# Patient Record
Sex: Female | Born: 1960 | Race: White | Hispanic: No | Marital: Married | Smoking: Never smoker
Health system: Southern US, Community
[De-identification: ages and names within clinical notes are randomized; demographics above are authoritative.]

---

## 2000-04-20 ENCOUNTER — Other Ambulatory Visit: Admission: RE | Admit: 2000-04-20 | Discharge: 2000-04-20 | Payer: Self-pay | Admitting: Obstetrics and Gynecology

## 2000-07-29 ENCOUNTER — Ambulatory Visit (HOSPITAL_COMMUNITY): Admission: RE | Admit: 2000-07-29 | Discharge: 2000-07-29 | Payer: Self-pay | Admitting: Obstetrics and Gynecology

## 2000-07-29 ENCOUNTER — Encounter: Payer: Self-pay | Admitting: Obstetrics and Gynecology

## 2000-08-24 ENCOUNTER — Encounter: Payer: Self-pay | Admitting: Neurology

## 2000-08-24 ENCOUNTER — Ambulatory Visit (HOSPITAL_COMMUNITY): Admission: RE | Admit: 2000-08-24 | Discharge: 2000-08-24 | Payer: Self-pay | Admitting: Neurology

## 2000-09-14 ENCOUNTER — Inpatient Hospital Stay (HOSPITAL_COMMUNITY): Admission: RE | Admit: 2000-09-14 | Discharge: 2000-09-16 | Payer: Self-pay | Admitting: Obstetrics and Gynecology

## 2000-09-14 ENCOUNTER — Encounter (INDEPENDENT_AMBULATORY_CARE_PROVIDER_SITE_OTHER): Payer: Self-pay | Admitting: Specialist

## 2000-11-04 ENCOUNTER — Encounter
Admission: RE | Admit: 2000-11-04 | Discharge: 2000-11-04 | Payer: Self-pay | Admitting: Physical Medicine & Rehabilitation

## 2000-11-04 ENCOUNTER — Encounter: Payer: Self-pay | Admitting: Obstetrics and Gynecology

## 2000-11-15 ENCOUNTER — Other Ambulatory Visit: Admission: RE | Admit: 2000-11-15 | Discharge: 2000-11-15 | Payer: Self-pay | Admitting: General Surgery

## 2001-02-06 ENCOUNTER — Encounter: Payer: Self-pay | Admitting: General Surgery

## 2001-02-06 ENCOUNTER — Encounter: Admission: RE | Admit: 2001-02-06 | Discharge: 2001-02-06 | Payer: Self-pay | Admitting: General Surgery

## 2002-02-07 ENCOUNTER — Ambulatory Visit (HOSPITAL_COMMUNITY): Admission: RE | Admit: 2002-02-07 | Discharge: 2002-02-07 | Payer: Self-pay | Admitting: Obstetrics and Gynecology

## 2002-02-07 ENCOUNTER — Encounter: Payer: Self-pay | Admitting: Obstetrics and Gynecology

## 2002-11-12 ENCOUNTER — Encounter: Payer: Self-pay | Admitting: General Surgery

## 2002-11-12 ENCOUNTER — Encounter: Admission: RE | Admit: 2002-11-12 | Discharge: 2002-11-12 | Payer: Self-pay | Admitting: General Surgery

## 2003-03-11 ENCOUNTER — Encounter: Payer: Self-pay | Admitting: General Surgery

## 2003-03-11 ENCOUNTER — Encounter: Admission: RE | Admit: 2003-03-11 | Discharge: 2003-03-11 | Payer: Self-pay | Admitting: General Surgery

## 2003-09-17 ENCOUNTER — Other Ambulatory Visit: Admission: RE | Admit: 2003-09-17 | Discharge: 2003-09-17 | Payer: Self-pay | Admitting: Obstetrics and Gynecology

## 2003-09-23 ENCOUNTER — Encounter: Admission: RE | Admit: 2003-09-23 | Discharge: 2003-09-23 | Payer: Self-pay | Admitting: Obstetrics and Gynecology

## 2003-12-20 ENCOUNTER — Encounter: Admission: RE | Admit: 2003-12-20 | Discharge: 2003-12-20 | Payer: Self-pay | Admitting: Obstetrics and Gynecology

## 2004-09-22 ENCOUNTER — Encounter: Admission: RE | Admit: 2004-09-22 | Discharge: 2004-09-22 | Payer: Self-pay | Admitting: Obstetrics and Gynecology

## 2004-09-24 ENCOUNTER — Other Ambulatory Visit: Admission: RE | Admit: 2004-09-24 | Discharge: 2004-09-24 | Payer: Self-pay | Admitting: Obstetrics and Gynecology

## 2005-01-27 ENCOUNTER — Encounter: Admission: RE | Admit: 2005-01-27 | Discharge: 2005-01-27 | Payer: Self-pay | Admitting: Obstetrics and Gynecology

## 2005-07-21 ENCOUNTER — Encounter: Admission: RE | Admit: 2005-07-21 | Discharge: 2005-07-21 | Payer: Self-pay | Admitting: General Surgery

## 2005-09-27 ENCOUNTER — Other Ambulatory Visit: Admission: RE | Admit: 2005-09-27 | Discharge: 2005-09-27 | Payer: Self-pay | Admitting: Obstetrics and Gynecology

## 2006-01-28 ENCOUNTER — Encounter: Admission: RE | Admit: 2006-01-28 | Discharge: 2006-01-28 | Payer: Self-pay | Admitting: Obstetrics and Gynecology

## 2006-09-22 ENCOUNTER — Other Ambulatory Visit: Admission: RE | Admit: 2006-09-22 | Discharge: 2006-09-22 | Payer: Self-pay | Admitting: Obstetrics and Gynecology

## 2007-01-13 ENCOUNTER — Encounter: Admission: RE | Admit: 2007-01-13 | Discharge: 2007-01-13 | Payer: Self-pay | Admitting: Family Medicine

## 2007-01-31 ENCOUNTER — Encounter: Admission: RE | Admit: 2007-01-31 | Discharge: 2007-01-31 | Payer: Self-pay | Admitting: Obstetrics and Gynecology

## 2007-03-23 ENCOUNTER — Encounter: Admission: RE | Admit: 2007-03-23 | Discharge: 2007-03-23 | Payer: Self-pay | Admitting: Obstetrics and Gynecology

## 2007-09-26 ENCOUNTER — Other Ambulatory Visit: Admission: RE | Admit: 2007-09-26 | Discharge: 2007-09-26 | Payer: Self-pay | Admitting: Obstetrics and Gynecology

## 2008-02-01 ENCOUNTER — Encounter: Admission: RE | Admit: 2008-02-01 | Discharge: 2008-02-01 | Payer: Self-pay | Admitting: Obstetrics and Gynecology

## 2008-02-08 ENCOUNTER — Encounter: Admission: RE | Admit: 2008-02-08 | Discharge: 2008-02-08 | Payer: Self-pay | Admitting: Obstetrics and Gynecology

## 2008-10-03 ENCOUNTER — Other Ambulatory Visit: Admission: RE | Admit: 2008-10-03 | Discharge: 2008-10-03 | Payer: Self-pay | Admitting: Obstetrics and Gynecology

## 2009-02-10 ENCOUNTER — Encounter: Admission: RE | Admit: 2009-02-10 | Discharge: 2009-02-10 | Payer: Self-pay | Admitting: Obstetrics and Gynecology

## 2009-10-01 IMAGING — MG MM SCREEN MAMMOGRAM BILATERAL
4 series · 4 of 4 positions shown · non-contrast
Comparison: none

DG SCREEN MAMMOGRAM BILATERAL
Bilateral CC and MLO view(s) were taken.
Technologist: Yulie Nagra

DIGITAL SCREENING MAMMOGRAM WITH CAD:
The breast tissue is extremely dense.  A possible mass is noted in the left breast.  Spot 
compression views and possibly sonography are recommended for further evaluation.  In the right 
breast, no masses or malignant type calcifications are identified.  Compared with prior studies.

[R CC]
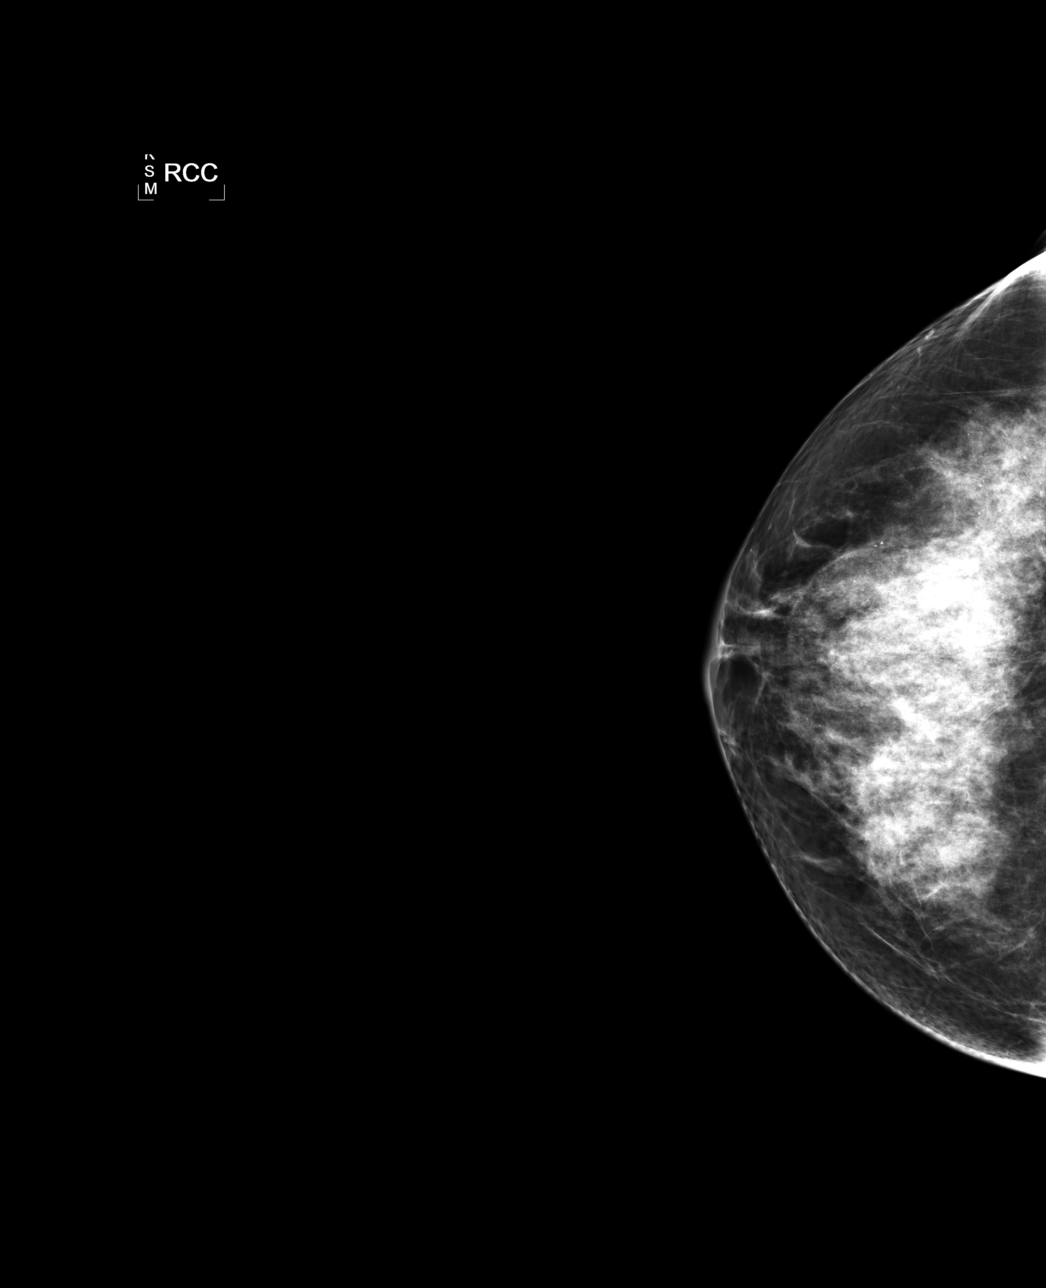

[L CC]
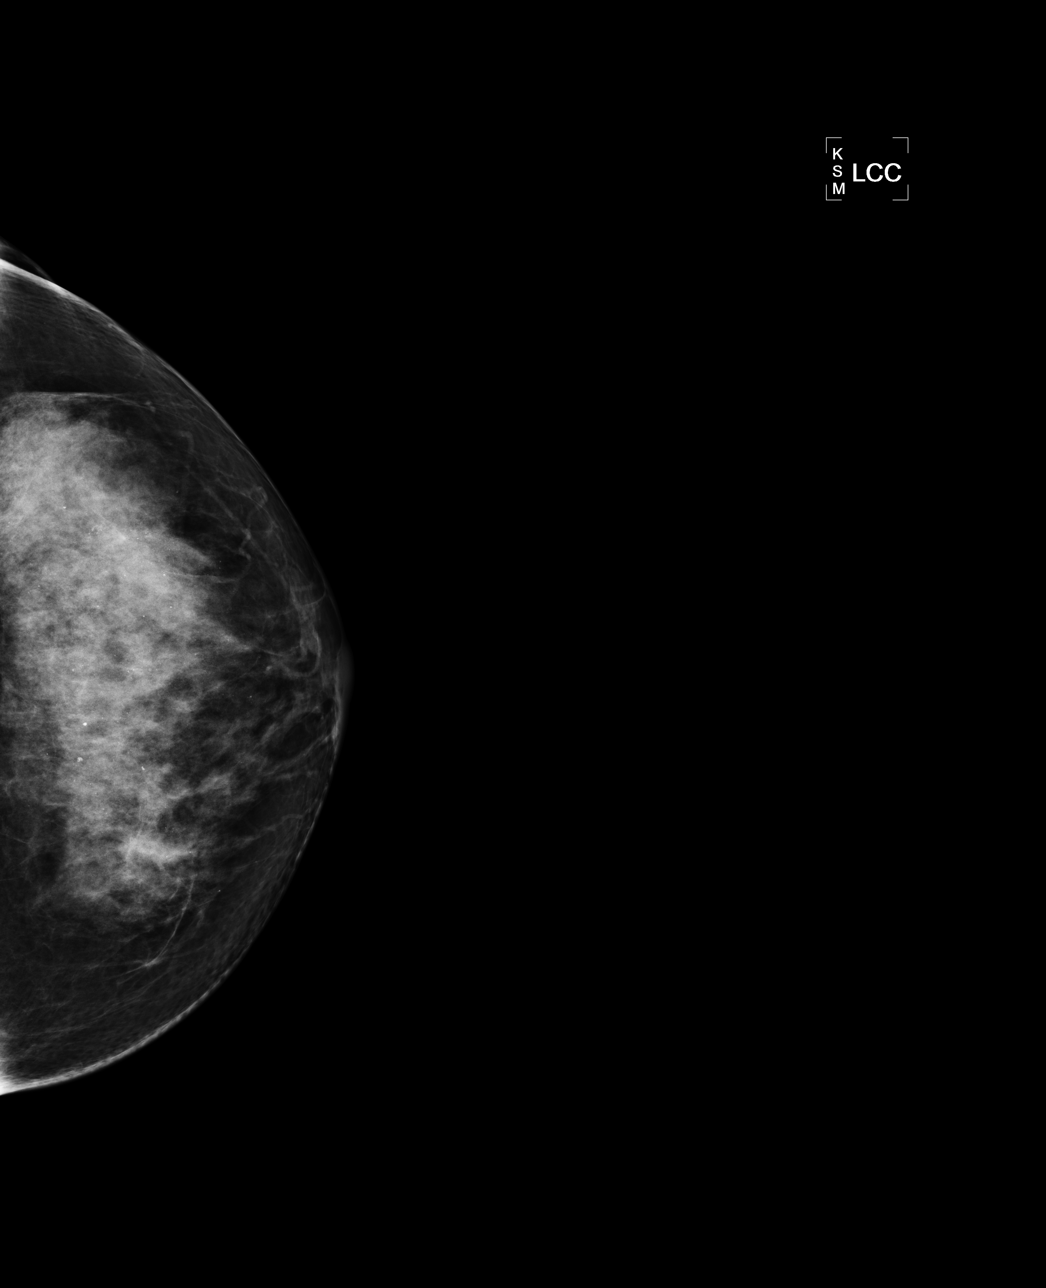

[L MLO]
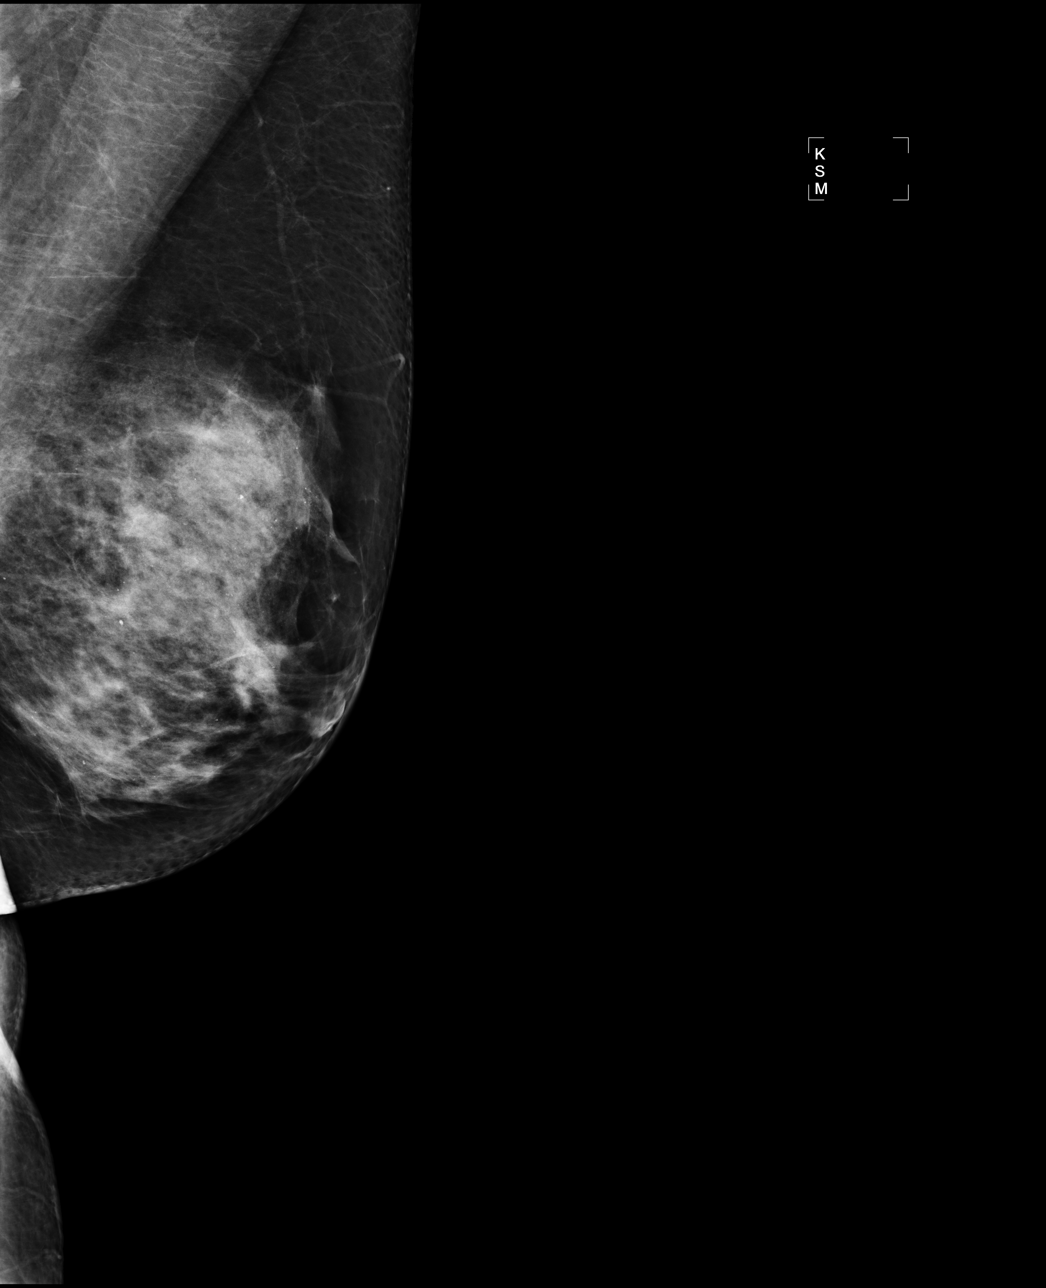

[R MLO]
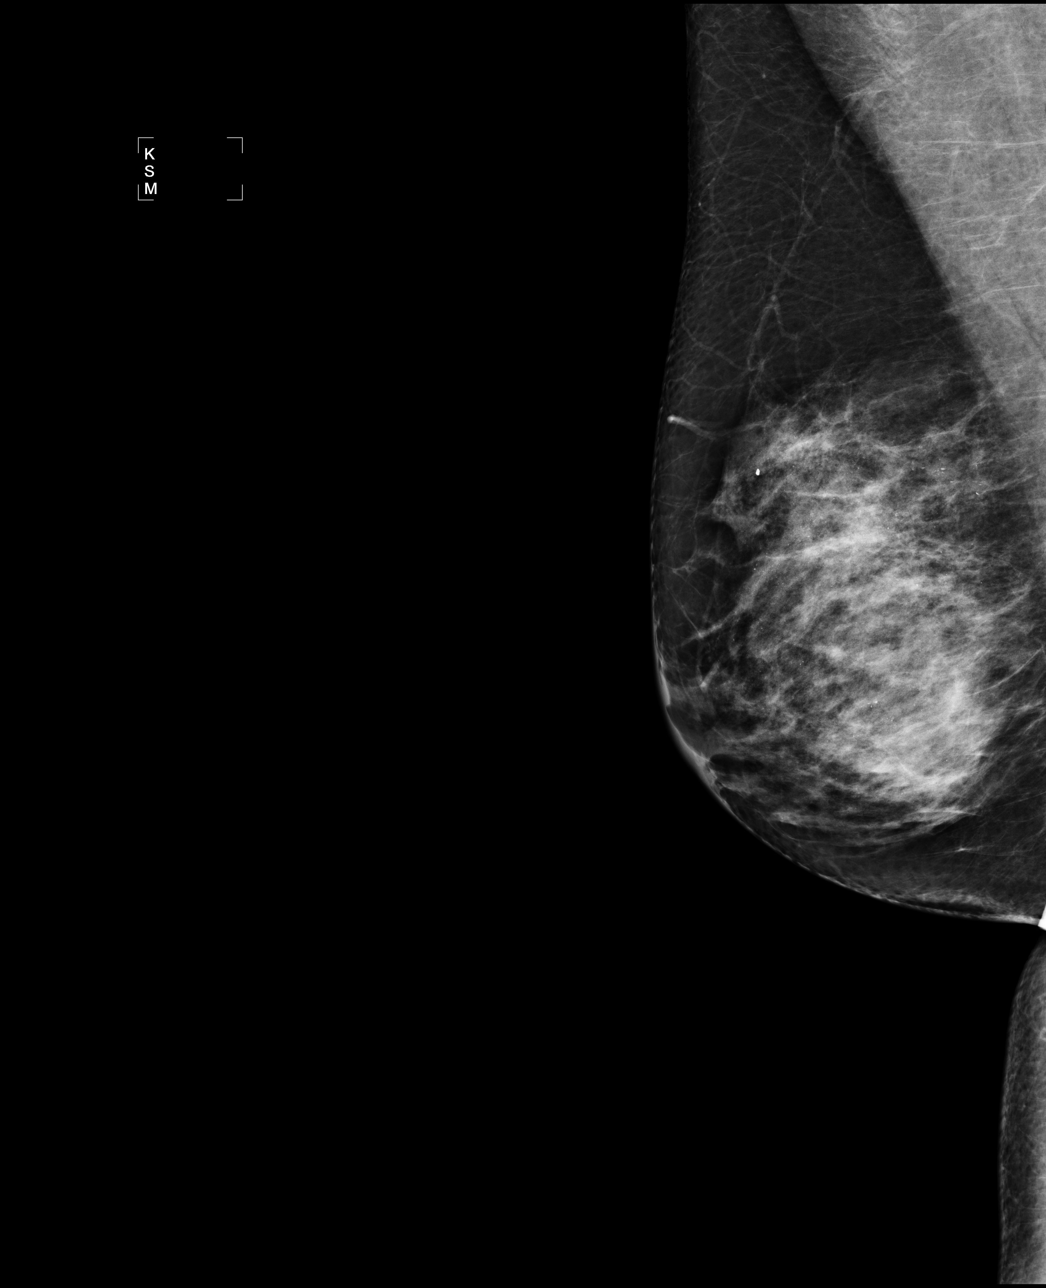

[4 of 4 positions shown; findings below may reference images not displayed]

IMPRESSION: Possible mass, left breast.  Additional evaluation is indicated.  The patient will be contacted for
additional studies and a supplementary report will follow.  No specific mammographic evidence of 
malignancy, right breast.

ASSESSMENT: Need additional imaging evaluation and/or prior mammograms for comparison - BI-RADS 0

Further imaging of the left breast.
ANALYZED BY COMPUTER AIDED DETECTION. , THIS PROCEDURE WAS A DIGITAL MAMMOGRAM.

## 2009-11-05 ENCOUNTER — Other Ambulatory Visit: Admission: RE | Admit: 2009-11-05 | Discharge: 2009-11-05 | Payer: Self-pay | Admitting: Obstetrics and Gynecology

## 2010-02-11 ENCOUNTER — Encounter: Admission: RE | Admit: 2010-02-11 | Discharge: 2010-02-11 | Payer: Self-pay | Admitting: Obstetrics and Gynecology

## 2010-08-10 ENCOUNTER — Encounter: Payer: Self-pay | Admitting: Obstetrics and Gynecology

## 2010-11-10 ENCOUNTER — Other Ambulatory Visit (HOSPITAL_COMMUNITY)
Admission: RE | Admit: 2010-11-10 | Discharge: 2010-11-10 | Disposition: A | Discharge status: Home / Self Care | Payer: Managed Care, Other (non HMO) | Source: Ambulatory Visit | Attending: Obstetrics and Gynecology | Admitting: Obstetrics and Gynecology

## 2010-11-10 ENCOUNTER — Other Ambulatory Visit: Payer: Self-pay | Admitting: Obstetrics and Gynecology

## 2010-11-10 DIAGNOSIS — Z01419 Encounter for gynecological examination (general) (routine) without abnormal findings: Secondary | ICD-10-CM | POA: Insufficient documentation

## 2010-12-04 NOTE — H&P (Signed)
[facility]  Patient:    Bianca Ramos, Bianca Ramos                        MRN: [id number] Attending:  Beather Arbour. Thomasena Edis, M.D. CC:         [facility]   History and Physical  HISTORY OF PRESENT ILLNESS:  The patient is a 50 year old gravida 0 who presented in early October to establish GYN care.  She was found on pelvic exam to have a large left ovary and subsequently underwent a pelvic ultrasound that showed a complex mass of the left ovary, 3.7 x 3.5 x 3.4 cm.  She subsequently had a followup ultrasound a few weeks later.  The mass was found to be stable, approximately 4.7 x 3.2 x 3.0 cm, and found to be highly suspicious for a dermoid.  The patient is thus admitted for an exploratory laparotomy, total abdominal hysterectomy, and bilateral salpingo-oophorectomy. The patient understands that if she undergoes a bilateral salpingo-oophorectomy this will decrease her risk of developing ovarian cancer but will not totally and completely eliminate it.  Risks of surgery including anesthetic complications, hemorrhage, infection, damage to adjacent organs including bladder, bowel, blood vessels, or ureter were discussed with the patient.  She was made aware of postoperative risks which include wound infection, urinary tract infection, atelectasis, pneumonia, or DVT which could result in a pulmonary embolus and she expressed understanding of and acceptance of these risks.  In addition, even though this is not especially suspicious for malignancy, we have asked for stand-by assist from GYN oncologist, Dr. De Blanch.  The patient does know that we will do a Pfannenstiel incision because the index of suspicion of this being malignant is low.  PAST MEDICAL HISTORY:  Abnormal Pap smear approximately 6-7 years ago treated with laser surgery.  History of a seizure disorder, for which the patient has recently seen Dr. Sandria Manly.  History of hemorrhoids.  ALLERGIES:  ASPIRIN and IBUPROFEN cause swelling.  MEDICATIONS:  Colace daily for spotting.  FAMILY HISTORY:  Significant for diabetes in a paternal uncle and significant for hypertension in the patients father and MI in the patients father.  SOCIAL HISTORY:  The patient is a homemaker.  PHYSICAL EXAMINATION:  HEENT:  Normal.  NECK:  Supple without thyromegaly.  LUNGS:  Clear to auscultation.  CARDIAC:  Regular rate and rhythm.  ABDOMEN:  Soft, nontender.  No hepatosplenomegaly.  PELVIC:  BUS normal.  Vagina normal.  Pap smear performed October 2001 within normal limits.  Uterus is normal and mobile.  There is a left adnexal mass palpated.  Rectal is confirmatory.  No mass.  ASSESSMENT AND PLAN:  The patient is a 50 year old gravida 0 admitted for exploratory laparotomy, total abdominal hysterectomy, and bilateral salpingo-oophorectomy due to a complex left ovarian mass thought to be a palpable dermoid.  Risks have been explained to the patient and she expresses understanding of and acceptance of these risks.  In addition, a CA 125 was done and noted to be normal at 8.2, although the patient understands this does not mean this is not a malignancy.  In addition, the patient has had a workup for her seizure disorder by Dr. Avie Echevaria, who states that the patient is fine to proceed with the surgery. DD:  09/14/00 TD:  09/14/00 Job: [id number]

## 2010-12-04 NOTE — Op Note (Signed)
[facility]  Patient:    Bianca Ramos, Bianca Ramos                         MRN: [id number] Adm. Date:  [id number] Attending:  Madelyn Flavors CC:         [facility]   Operative Report  PREOPERATIVE DIAGNOSIS:  Left ovarian complex cystic mass.  POSTOPERATIVE DIAGNOSIS:  Left dermoid cyst.  OPERATION/PROCEDURE:  Exploratory laparotomy, total abdominal hysterectomy, bilateral salpingo-oophorectomy.  SURGEON:  Beather Arbour. Thomasena Edis, M.D.  ASSISTANT:  Guy Sandifer. Arleta Creek, M.D.  Arline AspLurline Idol.  ANESTHESIA:  General endotracheal.  FLUIDS:  2000 cc of crystalloids.  ESTIMATED BLOOD LOSS:  150 cc.  FINDINGS:  Left dermoid cyst on frozen section.  DESCRIPTION OF PROCEDURE:  The patient was brought to the operating room, identified on the operating room table.  After induction of adequate general endotracheal anesthesia the patient was placed in the supine position and prepped and draped in the usual sterile fashion.  A Foley catheter was placed. A Pfannenstiel incision was made and carried down down to the fascia.  The fascia was scored in the midline, extended bilaterally, using Mayo scissors. It was separated free from the underlying muscle.  The muscles were separated in the midline down to the symphysis.  The patient was placed in Trendelenburg and the peritoneum was entered sharply and carefully taking care to avoid bowel or other abdominal contents.  The peritoneal incision was extended superiorly and then inferiorly down to the bladder edge.  The abdominal pelvic cavity was explored.  There were noted to be no enlarged pelvic or periaortic nodes.  All peritoneal surfaces were noted to be smooth.  There were noted to be some right upper quadrant adhesions. Washings had been previously taken.  The OConner-OSullivan retractor was placed, the bladder blade was placed, and wet packs were used to pack the bowel out of the operative field and an  abdominal retractor was placed.  Kellys were placed at the lateral border of the uterus.  The uterus was elevated up to a level of the incision.  The left round ligament was ligated with a suture of #0 Vicryl, divided and the anterior leaf of the broad ligament was divided to the level overlying the endocervical os.  The clear space was developed and the left infundibulopelvic ligament pedicle was clamped with a Heaney and ligated with a free-tie and then suture ligatured with #0 Vicryl.  The utero-ovarian ligament pedicle was then cut and the left ovary and tube were sent for frozen section.  The vessels were skeletonized on the left and using a curved Heaney the uterine vessels were clamped with a curved Heaney, cut and suture ligated using a suture of #0 Vicryl.  Attention was turned to the right.  The right round ligament was ligated with a figure-of-eight suture of #0 Vicryl, divided with cautery, and the anterior lip of the broad ligament was divided to the level overlying the cervical os. The clear space was developed and the right infundibulopelvic ligament was clamped with a curved Heaney, cut, tied with a free-tie and then with a suture ligature of #0 Vicryl.  The vessels on the right were then skeletonized after the bladder was noted to be well down.  The uterine vessel pedicle was clamped, cut, and suture ligated using a suture of #0 Vicryl.  Successive cardinal, uterosacral ligament bites were taken bilaterally using straight Heaney, cut and then suture  ligated using sutures of #0 Vicryl.  This was done after noting that the bladder was reflected downward away from the area overlying the cervix.  This was done using both sharp and blunt dissection.  A curved Heaney was then placed at the inferior most portion of the cervix across the right uterosacral ligament.  This pedicle was cut and a figure-of-eight suture of #0 Vicryl was then placed. The vagina was entered using  Satinsky scissors.  The cervix was separated from the vagina.  An angle suture was placed at either angle of the vaginal cuff incision.  Figure-of-eight sutures were then placed to close the vaginal cuff.  Excellent hemostasis was noted.  An additional suture of #0 Vicryl was used to plicate the uterosacral ligaments for pelvic support.  The pelvis was copiously irrigated with warm lactated Ringers and excellent hemostasis was achieved using cautery.  After noting excellent hemostasis in all the pedicles the patient was taken out of Trendelenburg.  The OConner-OSullivan retractor as well as the bladder blade retractor, and the abdominal wall retractor were removed.  All packs were removed as well.  Kelly clamps were placed on the peritoneum and the peritoneum was closed using a running suture of 2-0 Vicryl.  The fascial areas were examined for evidence of hemostasis and excellent hemostasis was achieved using cautery.  The fascia was closed using two sutures of #0 PDS in either angle of the incision, went to the midline and tied.  The subcutaneous tissue was irrigated copiously with warm lactated Ringers and excellent hemostasis was achieved using cautery.  The skin was closed with staples and Steri-Strips were applied.  The patient tolerated the procedure well without apparent complications and transferred to the recovery room in stable condition after all instrument, sponge, and needle counts were correct. DD:  09/14/00 TD:  09/14/00 Job: [id number]

## 2010-12-04 NOTE — Op Note (Signed)
[facility]  Patient:    Bianca Ramos, Bianca Ramos                         MRN: [id number] Proc. Date: 09/14/00 Adm. Date:  [id number] Attending:  Madelyn Flavors CC:         [facility]   Operative Report  PREOPERATIVE DIAGNOSIS:  Left vaginal cuff bleeder.  POSTOPERATIVE DIAGNOSIS:  Left vaginal cuff bleeder.  PROCEDURE:  Figure-of-eight suture of left vaginal cuff.  SURGEON:  Beather Arbour. Thomasena Edis, M.D.  ANESTHESIA:  General by mask.  DRAINS:  Foley.  COMPLICATIONS:  None.  FLUIDS: Total of 3500 cc of crystalloid for both procedures.  ESTIMATED BLOOD LOSS:  200 cc vaginal cuff bleeding in the recovery room and 75 cc during suture of vaginal cuff.  FINDINGS:  Left vaginal cuff bleeder, left vaginal cuff not entirely coapted and closed.  DESCRIPTION OF PROCEDURE:  I was called at approximately 9:50 a.m. for vaginal bleeding. The patient had saturated two pads and a towel had some blood on it as well and thought to approximate 200 cc of blood loss per the nurses. I came as soon as I was called and performed a speculum examination in the recovery room. I could see that there was bleeding from the left vaginal cuff and this area looked as if the suture of the vaginal cuff at the anterior peritoneal site had not entirely caught the distal most portion of the vaginal cuff. A decision was made to take the patient to the operating room in order to suture the left vaginal cuff. The decision was made to do this so that the pain would have excellent pain relief that she would be comfortable and the vaginal cuff bleeder could easily be visualized. This was done to optimize the outcome of the suture of the vaginal cuff bleeder.  The patient was brought to the operating room and identified on the operating room table.  After induction of adequate general anesthesia by mask, the patient was placed in the dorsal lithotomy position and draped in the  usual sterile fashion. The patient had been previously prepped. A weighted speculum was placed as well as an anterior vaginal retractor was placed. There was noted to be approximately 75 cc of blood in the vagina. This was suctioned and a very careful inspection of the vaginal cuff was performed. The right vaginal cuff was noted to be beautifully approximated with no bleeding noted whatsoever. There was noted to be no oozing form above this as well. The left vaginal cuff at the distal most portion was noted to be not entirely coapted and not closed. It was noted that the entire vaginal cuff was well closed at the intraperitoneal portion and it just appears that the distal most portion of the cuff was not contained within the stitches on the left portion of the vaginal cuff. Allis clamps were used to identify this area. The vaginal cuff edges on the left were everted and I could see a generalized oozing of the cuff as well as cuff bleeder at the angled portion on the left. Successive figure-of-eight sutures of #0 Vicryl were used to close the vaginal cuff. Great care was taken to adequately close the cuff but not place them too deep. This was performed with the patient in Trendelenburg. With closure of the left vaginal cuff, the bleeding was noted to totally and completely disappear. I watched the vaginal cuff for quite  a while and I could see no further bleeding.  Dr. De Blanch was in the operating room and asked to step in and visualize the vaginal cuff and he agreed that it was beautifully closed. We also consulted regarding the chance that this could be an intraperitoneal bleed, but the patients abdomen is soft and nontender and all her vital signs are stable. In addition, it would seem that if there were intraperitoneal bleeding this would have been seen on the right most portion of the vaginal cuff as well. In addition, I could easily identify the bleeding of the left  vaginal cuff and with placement of sutures this totally and completely resolve. Thus my thought process was that if this were intraperitoneal bleed, we would have seen oozing still from the vaginal cuff. Dr. Stanford Breed agreed. He and I both agreed that to do an exploratory laparotomy would be to increase the patients morbidity significantly when I could easily see the vaginal cuff bleeders, the bleeding vaginal at the left angle, and excellent hemostasis was achieved with placement of the figure-of-eight sutures. A decision was made of course to follow serial CBCs to assess the patients hemoglobin. At that point, the procedure was then terminated. I went and notified the recovery room nurses of the findings. I returned back to the operating room again and placed the speculum and there was noted to be no bleeding at all and the cuff was noted to be beautifully hemostatic. Since the patient has been in the recovery room, I have examined her yet again and there is no bleeding whatsoever. I also talked at length with the patients husband drawing him a diagram as well as other family members explaining that some postop bleeding is a known complication of the procedure but this at this time appears to be well controlled and to have been entirely treated but we will watch her very closely and follow serial hemoglobins. All the familys questions were answered. The patient tolerated the procedure well without apparent complications and was transferred to the recovery room in stable condition after all instrument, sponge, and needle counts were correct. The patient will be given postoperative antibiotics for two days. DD:  09/14/00 TD:  09/15/00 Job: [id number]

## 2010-12-04 NOTE — Discharge Summary (Signed)
[facility]  Patient:    Bianca Ramos, Bianca Ramos                         MRN: [id number] Adm. Date:  [id number] Disc. Date: [id number] Attending:  Madelyn Flavors CC:         [facility]   Discharge Summary  HISTORY OF PRESENT ILLNESS:  This patient is a 50 year old, gravida 0 who presented in early October to establish GYN care. At that time, she was found to have an enlarge left ovary and underwent a pelvic ultrasound which revealed a complex mass of the left ovary 3.7 x 3.5 x 3.4 cm. A follow-up ultrasound revealed the mass to be in essence stable but suspicious for a dermoid. The patient was admitted for an exploratory laparotomy, total abdominal hysterectomy, and bilateral salpingo-oophorectomy. For further details please see the dictated history and physical.  HOSPITAL COURSE:  The patient was admitted and underwent a TAH, BSO, exploratory laparotomy for left adnexal mass found to be a dermoid on frozen section. No malignancy was noted. The patient tolerated the surgery well; however, approximately one hour after arriving in the recovery room, I was called for heavy vaginal bleeding. Examination under anesthesia revealed it to be a left vaginal cuff bleeder. The patient was returned to the operating room where the left vaginal cuff bleeder was suture ligated using figure-of-eight sutures. Dr. Stanford Breed was in the operating room suite and agreed with this management and agreed there was no need for an exploratory laparotomy or laparoscopy as the cuff bleeder could easily be seen. Postoperatively, the patient had a very stable postoperative course. Postoperative hemoglobin was 11.0. She was discharged home on postoperative day two with no vaginal bleeding. The incision was noted to be clean, dry and intact. The abdomen was soft and nontender with bowel sounds. Extensive discharge instructions were given and she was urged to return to the office  in four days at [facility] for staple removal and to call for any problems. DD:  10/05/00 TD:  10/06/00 Job: [id number]

## 2010-12-22 ENCOUNTER — Other Ambulatory Visit: Payer: Self-pay | Admitting: Gastroenterology

## 2010-12-23 ENCOUNTER — Other Ambulatory Visit: Payer: Self-pay | Admitting: Obstetrics and Gynecology

## 2010-12-23 DIAGNOSIS — Z1231 Encounter for screening mammogram for malignant neoplasm of breast: Secondary | ICD-10-CM

## 2011-02-23 ENCOUNTER — Ambulatory Visit
Admission: RE | Admit: 2011-02-23 | Discharge: 2011-02-23 | Disposition: A | Discharge status: Home / Self Care | Payer: Managed Care, Other (non HMO) | Source: Ambulatory Visit | Attending: Obstetrics and Gynecology | Admitting: Obstetrics and Gynecology

## 2011-02-23 DIAGNOSIS — Z1231 Encounter for screening mammogram for malignant neoplasm of breast: Secondary | ICD-10-CM

## 2012-01-25 ENCOUNTER — Other Ambulatory Visit: Payer: Self-pay | Admitting: Family Medicine

## 2012-01-25 DIAGNOSIS — Z1231 Encounter for screening mammogram for malignant neoplasm of breast: Secondary | ICD-10-CM

## 2012-02-29 ENCOUNTER — Ambulatory Visit
Admission: RE | Admit: 2012-02-29 | Discharge: 2012-02-29 | Disposition: A | Discharge status: Home / Self Care | Payer: Managed Care, Other (non HMO) | Source: Ambulatory Visit | Attending: Family Medicine | Admitting: Family Medicine

## 2012-02-29 DIAGNOSIS — Z1231 Encounter for screening mammogram for malignant neoplasm of breast: Secondary | ICD-10-CM

## 2013-01-23 ENCOUNTER — Other Ambulatory Visit: Payer: Self-pay

## 2013-01-23 DIAGNOSIS — Z1231 Encounter for screening mammogram for malignant neoplasm of breast: Secondary | ICD-10-CM

## 2013-03-01 ENCOUNTER — Ambulatory Visit
Admission: RE | Admit: 2013-03-01 | Discharge: 2013-03-01 | Disposition: A | Discharge status: Home / Self Care | Payer: Managed Care, Other (non HMO) | Source: Ambulatory Visit

## 2013-03-01 DIAGNOSIS — Z1231 Encounter for screening mammogram for malignant neoplasm of breast: Secondary | ICD-10-CM

## 2013-09-07 ENCOUNTER — Encounter: Payer: Managed Care, Other (non HMO) | Admitting: Sports Medicine

## 2013-09-11 ENCOUNTER — Encounter: Payer: Self-pay | Admitting: Sports Medicine

## 2013-09-11 ENCOUNTER — Ambulatory Visit (INDEPENDENT_AMBULATORY_CARE_PROVIDER_SITE_OTHER): Payer: Managed Care, Other (non HMO) | Admitting: Sports Medicine

## 2013-09-11 VITALS — BP 119/81 | HR 67 | Ht 67.0 in | Wt 160.0 lb

## 2013-09-11 DIAGNOSIS — M25519 Pain in unspecified shoulder: Secondary | ICD-10-CM

## 2013-09-11 DIAGNOSIS — M25511 Pain in right shoulder: Secondary | ICD-10-CM | POA: Insufficient documentation

## 2013-09-11 MED ORDER — TRAMADOL HCL 50 MG PO TABS: ORAL_TABLET | ORAL | Status: AC

## 2013-09-11 NOTE — Assessment & Plan Note (Signed)
Symptoms are suspicious for rotator cuff dysfunction. It is waking her from sleep. Subacromial injection performed today, tramadol for pain, a single visit with physical therapy, return to see me in one month.

## 2013-09-11 NOTE — Progress Notes (Signed)
   Subjective:    I'm seeing this patient as a consultation for:  Dr. Angelena SoleWeston Saunders  CC: Shoulder pain  HPI: Patient is a pleasant 53 yo female who presents with shoulder pain. Pain started a year ago after exercising with planks with her personal trainer. It got better over the year, but has been worse over the past few weeks. Pain is worse with exercise. The pain was much worse last week, but has improved since she stopped exercises. Pt describes it as a dull ache especially when reaching overhead. Symptoms are moderate, persistent. No radicular symptoms, very little neck pain.  Past medical history, Surgical history, Family history not pertinant except as noted below, Social history, Allergies, and medications have been entered into the medical record, reviewed, and no changes needed.   Review of Systems: No headache, visual changes, nausea, vomiting, diarrhea, constipation, dizziness, abdominal pain, skin rash, fevers, chills, night sweats, weight loss, swollen lymph nodes, body aches, joint swelling, muscle aches, chest pain, shortness of breath, mood changes, visual or auditory hallucinations.   Objective:   General: Well Developed, well nourished, and in no acute distress.  Neuro/Psych: Alert and oriented x3, extra-ocular muscles intact, able to move all 4 extremities, sensation grossly intact. Skin: Warm and dry, no rashes noted.  Respiratory: Not using accessory muscles, speaking in full sentences, trachea midline.  Cardiovascular: Pulses palpable, no extremity edema. Abdomen: Does not appear distended. Right Shoulder: Inspection reveals no abnormalities, atrophy or asymmetry. Palpation is normal with no tenderness over AC joint or bicipital groove. ROM is full in all planes. Rotator cuff strength normal throughout. Positive Neer and Hawkin's tests, empty can sign. Speeds and Yergason's tests normal. No labral pathology noted with negative Obrien's, negative clunk and good  stability. Normal scapular function observed. No painful arc and no drop arm sign. No apprehension sign  Procedure: Real-time Ultrasound Guided Injection of right subacromial bursa Device: GE Logiq E  Verbal informed consent obtained.  Time-out conducted.  Noted no overlying erythema, induration, or other signs of local infection.  Skin prepped in a sterile fashion.  Local anesthesia: Topical Ethyl chloride.  With sterile technique and under real time ultrasound guidance:  1 cc Kenalog 40, 4 cc lidocaine injected easily into the subacromial bursa. Completed without difficulty  Pain immediately resolved suggesting accurate placement of the medication.  Advised to call if fevers/chills, erythema, induration, drainage, or persistent bleeding.  Images permanently stored and available for review in the ultrasound unit.  Impression: Technically successful ultrasound guided injection.  Impression and Recommendations:   This case required medical decision making of moderate complexity.

## 2013-09-25 ENCOUNTER — Ambulatory Visit (INDEPENDENT_AMBULATORY_CARE_PROVIDER_SITE_OTHER): Payer: Managed Care, Other (non HMO) | Admitting: Physical Therapy

## 2013-09-25 DIAGNOSIS — M25519 Pain in unspecified shoulder: Secondary | ICD-10-CM

## 2013-09-25 DIAGNOSIS — M6281 Muscle weakness (generalized): Secondary | ICD-10-CM

## 2013-10-09 ENCOUNTER — Ambulatory Visit (INDEPENDENT_AMBULATORY_CARE_PROVIDER_SITE_OTHER): Payer: Managed Care, Other (non HMO) | Admitting: Sports Medicine

## 2013-10-09 ENCOUNTER — Encounter: Payer: Self-pay | Admitting: Sports Medicine

## 2013-10-09 VITALS — BP 114/72 | HR 77 | Resp 16 | Wt 159.5 lb

## 2013-10-09 DIAGNOSIS — M25519 Pain in unspecified shoulder: Secondary | ICD-10-CM

## 2013-10-09 DIAGNOSIS — M25511 Pain in right shoulder: Secondary | ICD-10-CM

## 2013-10-09 NOTE — Progress Notes (Signed)
  Subjective:    CC: Follow up  HPI: Rotator cuff dysfunction: The last visit we injected the subacromial bursa, I also placed her through formal physical therapy, she returns today with pain essentially completely resolved. She gets occasional discomfort in the mid trapezius on the right side, overall happy with the results so far.  Past medical history, Surgical history, Family history not pertinant except as noted below, Social history, Allergies, and medications have been entered into the medical record, reviewed, and no changes needed.   Review of Systems: No fevers, chills, night sweats, weight loss, chest pain, or shortness of breath.   Objective:    General: Well Developed, well nourished, and in no acute distress.  Neuro: Alert and oriented x3, extra-ocular muscles intact, sensation grossly intact.  HEENT: Normocephalic, atraumatic, pupils equal round reactive to light, neck supple, no masses, no lymphadenopathy, thyroid nonpalpable.  Skin: Warm and dry, no rashes. Cardiac: Regular rate and rhythm, no murmurs rubs or gallops, no lower extremity edema.  Respiratory: Clear to auscultation bilaterally. Not using accessory muscles, speaking in full sentences. Right Shoulder: Inspection reveals no abnormalities, atrophy or asymmetry. Palpation is normal with no tenderness over AC joint or bicipital groove. ROM is full in all planes. Rotator cuff strength normal throughout. No signs of impingement with negative Neer and Hawkin's tests, empty can sign. Speeds and Yergason's tests normal. No labral pathology noted with negative Obrien's, negative clunk and good stability. Normal scapular function observed. No painful arc and no drop arm sign. No apprehension sign  Impression and Recommendations:

## 2013-10-09 NOTE — Assessment & Plan Note (Signed)
Right mid trapezial pain, adding the neck sprain rehabilitation exercises however this is probably an underlying cervical radiculitis. Return to see me on an as-needed basis, continue tramadol as needed, avoid overhead activities when able, and continue rotator cuff rehabilitation exercises.

## 2014-01-24 ENCOUNTER — Other Ambulatory Visit: Payer: Self-pay

## 2014-01-24 DIAGNOSIS — Z1231 Encounter for screening mammogram for malignant neoplasm of breast: Secondary | ICD-10-CM

## 2014-03-04 ENCOUNTER — Ambulatory Visit
Admission: RE | Admit: 2014-03-04 | Discharge: 2014-03-04 | Disposition: A | Discharge status: Home / Self Care | Payer: Managed Care, Other (non HMO) | Source: Ambulatory Visit

## 2014-03-04 DIAGNOSIS — Z1231 Encounter for screening mammogram for malignant neoplasm of breast: Secondary | ICD-10-CM

## 2014-03-20 ENCOUNTER — Other Ambulatory Visit: Payer: Self-pay | Admitting: Gastroenterology

## 2014-06-21 ENCOUNTER — Ambulatory Visit (INDEPENDENT_AMBULATORY_CARE_PROVIDER_SITE_OTHER): Payer: Managed Care, Other (non HMO) | Admitting: Sports Medicine

## 2014-06-21 ENCOUNTER — Encounter: Payer: Self-pay | Admitting: Sports Medicine

## 2014-06-21 VITALS — BP 128/83 | HR 83 | Ht 67.0 in | Wt 159.0 lb

## 2014-06-21 DIAGNOSIS — M25511 Pain in right shoulder: Secondary | ICD-10-CM

## 2014-06-21 NOTE — Progress Notes (Signed)
  Subjective:    CC: right shoulder pain  HPI: Good response to subacromial injection 10 months ago. Did lift some suitcases recently in a trip to [place], now having recurrence of impingement type symptoms. Moderate, persistent, localized over the deltoid without radicular pain or symptoms.  Past medical history, Surgical history, Family history not pertinant except as noted below, Social history, Allergies, and medications have been entered into the medical record, reviewed, and no changes needed.   Review of Systems: No fevers, chills, night sweats, weight loss, chest pain, or shortness of breath.   Objective:    General: Well Developed, well nourished, and in no acute distress.  Neuro: Alert and oriented x3, extra-ocular muscles intact, sensation grossly intact.  HEENT: Normocephalic, atraumatic, pupils equal round reactive to light, neck supple, no masses, no lymphadenopathy, thyroid nonpalpable.  Skin: Warm and dry, no rashes. Cardiac: Regular rate and rhythm, no murmurs rubs or gallops, no lower extremity edema.  Respiratory: Clear to auscultation bilaterally. Not using accessory muscles, speaking in full sentences. Right Shoulder: Inspection reveals no abnormalities, atrophy or asymmetry. Palpation is normal with no tenderness over AC joint or bicipital groove. ROM is full in all planes. Rotator cuff strength normal throughout. positive Neer and Hawkin's tests, empty can. Speeds and Yergason's tests normal. No labral pathology noted with negative Obrien's, negative crank, negative clunk, and good stability. Normal scapular function observed. No painful arc and no drop arm sign. No apprehension sign  Procedure: Real-time Ultrasound Guided Injection of right subacromial bursa Device: GE Logiq E  Verbal informed consent obtained.  Time-out conducted.  Noted no overlying erythema, induration, or other signs of local infection.  Skin prepped in a sterile fashion.  Local  anesthesia: Topical Ethyl chloride.  With sterile technique and under real time ultrasound guidance:  1 mL kenalog 40, 3 mL lidocaine injected easily. Completed without difficulty  Pain immediately resolved suggesting accurate placement of the medication.  Advised to call if fevers/chills, erythema, induration, drainage, or persistent bleeding.  Images permanently stored and available for review in the ultrasound unit.  Impression: Technically successful ultrasound guided injection.  Impression and Recommendations:

## 2014-06-21 NOTE — Assessment & Plan Note (Signed)
Ten-month response to previous subacromial injection with physical therapy. Repeat injection today, continue home rehabilitation exercises, return to see me in one month.

## 2014-07-25 ENCOUNTER — Ambulatory Visit: Payer: Managed Care, Other (non HMO) | Admitting: Sports Medicine

## 2014-09-18 ENCOUNTER — Other Ambulatory Visit: Payer: Self-pay

## 2014-09-18 DIAGNOSIS — Z1231 Encounter for screening mammogram for malignant neoplasm of breast: Secondary | ICD-10-CM

## 2014-09-26 ENCOUNTER — Ambulatory Visit (INDEPENDENT_AMBULATORY_CARE_PROVIDER_SITE_OTHER): Payer: Managed Care, Other (non HMO) | Admitting: Sports Medicine

## 2014-09-26 ENCOUNTER — Encounter: Payer: Self-pay | Admitting: Sports Medicine

## 2014-09-26 VITALS — BP 130/85 | HR 72 | Ht 67.0 in | Wt 160.0 lb

## 2014-09-26 DIAGNOSIS — M25511 Pain in right shoulder: Secondary | ICD-10-CM | POA: Diagnosis not present

## 2014-09-26 NOTE — Progress Notes (Signed)
  Subjective:    CC: Right shoulder pain  HPI: This pleasant 54 year old female returns, she had right shoulder subacromial bursitis, and did extremely well with prior injections, the previous injection was 3-1/2 months ago. Unfortunately she is having recurrence of pain, moderate, persistent localized over the deltoid but actually also localized over the joint line, she did have a recent injury and a slip.  Past medical history, Surgical history, Family history not pertinant except as noted below, Social history, Allergies, and medications have been entered into the medical record, reviewed, and no changes needed.   Review of Systems: No fevers, chills, night sweats, weight loss, chest pain, or shortness of breath.   Objective:    General: Well Developed, well nourished, and in no acute distress.  Neuro: Alert and oriented x3, extra-ocular muscles intact, sensation grossly intact.  HEENT: Normocephalic, atraumatic, pupils equal round reactive to light, neck supple, no masses, no lymphadenopathy, thyroid nonpalpable.  Skin: Warm and dry, no rashes. Cardiac: Regular rate and rhythm, no murmurs rubs or gallops, no lower extremity edema.  Respiratory: Clear to auscultation bilaterally. Not using accessory muscles, speaking in full sentences. Right Shoulder: Inspection reveals no abnormalities, atrophy or asymmetry. Palpation is normal with no tenderness over AC joint or bicipital groove. ROM is full in all planes. Rotator cuff strength normal throughout. Mildly positive Neer and Hawkin's tests, empty can. Speeds and Yergason's tests normal. Strongly positive crank test Normal scapular function observed. No painful arc and no drop arm sign. No apprehension sign  Procedure: Real-time Ultrasound Guided Injection of right subacromial bursa  Device: GE Logiq E  Verbal informed consent obtained.  Time-out conducted.  Noted no overlying erythema, induration, or other signs of local  infection.  Skin prepped in a sterile fashion.  Local anesthesia: Topical Ethyl chloride.  With sterile technique and under real time ultrasound guidance:  1 mL kenalog 40, 3 mL lidocaine injected easily Completed without difficulty  Pain immediately resolved suggesting accurate placement of the medication.  Advised to call if fevers/chills, erythema, induration, drainage, or persistent bleeding.  Images permanently stored and available for review in the ultrasound unit.  Impression: Technically successful ultrasound guided injection.  Procedure: Real-time Ultrasound Guided Injection of right glenohumeral joint Device: GE Logiq E  Verbal informed consent obtained.  Time-out conducted.  Noted no overlying erythema, induration, or other signs of local infection.  Skin prepped in a sterile fashion.  Local anesthesia: Topical Ethyl chloride.  With sterile technique and under real time ultrasound guidance:  1 mL kenalog 40, 3 mL lidocaine injected easily Completed without difficulty  Pain immediately resolved suggesting accurate placement of the medication.  Advised to call if fevers/chills, erythema, induration, drainage, or persistent bleeding.  Images permanently stored and available for review in the ultrasound unit.  Impression: Technically successful ultrasound guided injection.  Impression and Recommendations:    I spent 40 minutes with this patient, greater than 50% was face-to-face time counseling regarding the above diagnosis as well as discussing and illness of her friend's son and offering guidance.

## 2014-09-26 NOTE — Assessment & Plan Note (Signed)
3 month response to subacromial injection. Glenohumeral and subacromial injections today. Return as needed.

## 2015-03-06 ENCOUNTER — Ambulatory Visit
Admission: RE | Admit: 2015-03-06 | Discharge: 2015-03-06 | Disposition: A | Discharge status: Home / Self Care | Payer: Managed Care, Other (non HMO) | Source: Ambulatory Visit

## 2015-03-06 DIAGNOSIS — Z1231 Encounter for screening mammogram for malignant neoplasm of breast: Secondary | ICD-10-CM

## 2016-02-10 ENCOUNTER — Other Ambulatory Visit: Payer: Self-pay | Admitting: Family Medicine

## 2016-02-10 DIAGNOSIS — Z1231 Encounter for screening mammogram for malignant neoplasm of breast: Secondary | ICD-10-CM

## 2016-04-01 ENCOUNTER — Ambulatory Visit
Admission: RE | Admit: 2016-04-01 | Discharge: 2016-04-01 | Disposition: A | Discharge status: Home / Self Care | Payer: Managed Care, Other (non HMO) | Source: Ambulatory Visit | Attending: Family Medicine | Admitting: Family Medicine

## 2016-04-01 DIAGNOSIS — Z1231 Encounter for screening mammogram for malignant neoplasm of breast: Secondary | ICD-10-CM

## 2017-01-27 ENCOUNTER — Other Ambulatory Visit: Payer: Self-pay | Admitting: Family Medicine

## 2017-01-27 DIAGNOSIS — Z1231 Encounter for screening mammogram for malignant neoplasm of breast: Secondary | ICD-10-CM

## 2017-04-05 ENCOUNTER — Ambulatory Visit
Admission: RE | Admit: 2017-04-05 | Discharge: 2017-04-05 | Disposition: A | Discharge status: Home / Self Care | Payer: BLUE CROSS/BLUE SHIELD | Source: Ambulatory Visit | Attending: Family Medicine | Admitting: Family Medicine

## 2017-04-05 DIAGNOSIS — Z1231 Encounter for screening mammogram for malignant neoplasm of breast: Secondary | ICD-10-CM

## 2018-01-25 ENCOUNTER — Other Ambulatory Visit: Payer: Self-pay | Admitting: Family Medicine

## 2018-01-25 DIAGNOSIS — Z1231 Encounter for screening mammogram for malignant neoplasm of breast: Secondary | ICD-10-CM

## 2018-04-07 ENCOUNTER — Ambulatory Visit
Admission: RE | Admit: 2018-04-07 | Discharge: 2018-04-07 | Disposition: A | Discharge status: Home / Self Care | Payer: BLUE CROSS/BLUE SHIELD | Source: Ambulatory Visit | Attending: Family Medicine | Admitting: Family Medicine

## 2018-04-07 DIAGNOSIS — Z1231 Encounter for screening mammogram for malignant neoplasm of breast: Secondary | ICD-10-CM

## 2019-02-01 ENCOUNTER — Other Ambulatory Visit: Payer: Self-pay | Admitting: Family Medicine

## 2019-02-01 DIAGNOSIS — Z1231 Encounter for screening mammogram for malignant neoplasm of breast: Secondary | ICD-10-CM

## 2019-04-10 ENCOUNTER — Other Ambulatory Visit: Payer: Self-pay

## 2019-04-10 ENCOUNTER — Ambulatory Visit
Admission: RE | Admit: 2019-04-10 | Discharge: 2019-04-10 | Disposition: A | Discharge status: Home / Self Care | Payer: BLUE CROSS/BLUE SHIELD | Source: Ambulatory Visit | Attending: Family Medicine | Admitting: Family Medicine

## 2019-04-10 DIAGNOSIS — Z1231 Encounter for screening mammogram for malignant neoplasm of breast: Secondary | ICD-10-CM

## 2020-02-06 ENCOUNTER — Other Ambulatory Visit: Payer: Self-pay | Admitting: Family Medicine

## 2020-02-06 DIAGNOSIS — Z1231 Encounter for screening mammogram for malignant neoplasm of breast: Secondary | ICD-10-CM

## 2020-04-10 ENCOUNTER — Other Ambulatory Visit: Payer: Self-pay

## 2020-04-10 ENCOUNTER — Ambulatory Visit
Admission: RE | Admit: 2020-04-10 | Discharge: 2020-04-10 | Disposition: A | Discharge status: Home / Self Care | Payer: BC Managed Care – PPO | Source: Ambulatory Visit | Attending: Family Medicine | Admitting: Family Medicine

## 2020-04-10 DIAGNOSIS — Z1231 Encounter for screening mammogram for malignant neoplasm of breast: Secondary | ICD-10-CM

## 2021-01-21 ENCOUNTER — Other Ambulatory Visit: Payer: Self-pay | Admitting: Family Medicine

## 2021-01-21 DIAGNOSIS — Z1231 Encounter for screening mammogram for malignant neoplasm of breast: Secondary | ICD-10-CM

## 2021-04-13 ENCOUNTER — Other Ambulatory Visit: Payer: Self-pay

## 2021-04-13 ENCOUNTER — Ambulatory Visit
Admission: RE | Admit: 2021-04-13 | Discharge: 2021-04-13 | Disposition: A | Discharge status: Home / Self Care | Payer: BC Managed Care – PPO | Source: Ambulatory Visit | Attending: Family Medicine | Admitting: Family Medicine

## 2021-04-13 DIAGNOSIS — Z1231 Encounter for screening mammogram for malignant neoplasm of breast: Secondary | ICD-10-CM
